# Patient Record
Sex: Female | Born: 1990 | Race: Black or African American | Hispanic: No | Marital: Single | Smoking: Current every day smoker
Health system: Southern US, Community
[De-identification: ages and names within clinical notes are randomized; demographics above are authoritative.]

## PROBLEM LIST (undated history)

## (undated) DIAGNOSIS — Z789 Other specified health status: Secondary | ICD-10-CM

---

## 2010-03-26 ENCOUNTER — Emergency Department (HOSPITAL_COMMUNITY): Admission: EM | Admit: 2010-03-26 | Discharge: 2010-03-26 | Payer: Self-pay | Admitting: Emergency Medicine

## 2010-09-07 ENCOUNTER — Inpatient Hospital Stay (HOSPITAL_COMMUNITY): Admission: AD | Admit: 2010-09-07 | Discharge: 2010-09-10 | Payer: Self-pay | Admitting: Obstetrics

## 2010-09-08 ENCOUNTER — Encounter (INDEPENDENT_AMBULATORY_CARE_PROVIDER_SITE_OTHER): Payer: Self-pay | Admitting: Obstetrics

## 2011-01-18 LAB — CBC
HCT: 24.2 % — ABNORMAL LOW (ref 36.0–46.0)
Hemoglobin: 12.4 g/dL (ref 12.0–15.0)
Hemoglobin: 8.2 g/dL — ABNORMAL LOW (ref 12.0–15.0)
Platelets: 161 10*3/uL (ref 150–400)
RBC: 4.03 MIL/uL (ref 3.87–5.11)
RDW: 15.8 % — ABNORMAL HIGH (ref 11.5–15.5)
WBC: 11.6 10*3/uL — ABNORMAL HIGH (ref 4.0–10.5)

## 2011-01-18 LAB — RPR: RPR Ser Ql: NONREACTIVE

## 2011-12-29 ENCOUNTER — Other Ambulatory Visit (HOSPITAL_COMMUNITY): Payer: Self-pay | Admitting: Obstetrics

## 2011-12-29 DIAGNOSIS — Z3490 Encounter for supervision of normal pregnancy, unspecified, unspecified trimester: Secondary | ICD-10-CM

## 2012-01-06 ENCOUNTER — Encounter (HOSPITAL_COMMUNITY): Payer: Self-pay

## 2012-01-06 ENCOUNTER — Ambulatory Visit (HOSPITAL_COMMUNITY)
Admission: RE | Admit: 2012-01-06 | Discharge: 2012-01-06 | Disposition: A | Discharge status: Home / Self Care | Payer: Managed Care, Other (non HMO) | Source: Ambulatory Visit | Attending: Obstetrics | Admitting: Obstetrics

## 2012-01-06 ENCOUNTER — Other Ambulatory Visit (HOSPITAL_COMMUNITY): Payer: Self-pay | Admitting: Obstetrics

## 2012-01-06 DIAGNOSIS — Z3490 Encounter for supervision of normal pregnancy, unspecified, unspecified trimester: Secondary | ICD-10-CM

## 2012-01-06 DIAGNOSIS — Z3689 Encounter for other specified antenatal screening: Secondary | ICD-10-CM | POA: Insufficient documentation

## 2012-01-08 LAB — OB RESULTS CONSOLE RPR: RPR: NONREACTIVE

## 2012-01-08 LAB — OB RESULTS CONSOLE HEPATITIS B SURFACE ANTIGEN: Hepatitis B Surface Ag: NEGATIVE

## 2012-01-08 LAB — OB RESULTS CONSOLE RUBELLA ANTIBODY, IGM: Rubella: IMMUNE

## 2012-01-08 LAB — OB RESULTS CONSOLE HIV ANTIBODY (ROUTINE TESTING): HIV: NONREACTIVE

## 2012-01-08 LAB — OB RESULTS CONSOLE ABO/RH: RH Type: POSITIVE

## 2012-02-22 ENCOUNTER — Other Ambulatory Visit (HOSPITAL_COMMUNITY): Payer: Self-pay | Admitting: Obstetrics

## 2012-02-22 DIAGNOSIS — Z3689 Encounter for other specified antenatal screening: Secondary | ICD-10-CM

## 2012-03-09 ENCOUNTER — Ambulatory Visit (HOSPITAL_COMMUNITY)
Admission: RE | Admit: 2012-03-09 | Discharge: 2012-03-09 | Disposition: A | Discharge status: Home / Self Care | Payer: Managed Care, Other (non HMO) | Source: Ambulatory Visit | Attending: Obstetrics | Admitting: Obstetrics

## 2012-03-09 DIAGNOSIS — Z363 Encounter for antenatal screening for malformations: Secondary | ICD-10-CM | POA: Insufficient documentation

## 2012-03-09 DIAGNOSIS — O358XX0 Maternal care for other (suspected) fetal abnormality and damage, not applicable or unspecified: Secondary | ICD-10-CM | POA: Insufficient documentation

## 2012-03-09 DIAGNOSIS — O34219 Maternal care for unspecified type scar from previous cesarean delivery: Secondary | ICD-10-CM | POA: Insufficient documentation

## 2012-03-09 DIAGNOSIS — Z1389 Encounter for screening for other disorder: Secondary | ICD-10-CM | POA: Insufficient documentation

## 2012-03-09 DIAGNOSIS — Z3689 Encounter for other specified antenatal screening: Secondary | ICD-10-CM

## 2012-06-17 LAB — OB RESULTS CONSOLE GBS: GBS: POSITIVE

## 2012-07-04 ENCOUNTER — Inpatient Hospital Stay (HOSPITAL_COMMUNITY): Payer: Managed Care, Other (non HMO) | Admitting: Anesthesiology

## 2012-07-04 ENCOUNTER — Inpatient Hospital Stay (HOSPITAL_COMMUNITY)
Admission: AD | Admit: 2012-07-04 | Discharge: 2012-07-06 | DRG: 766 | Disposition: A | Discharge status: Home / Self Care | Payer: Managed Care, Other (non HMO) | Source: Ambulatory Visit | Attending: Obstetrics | Admitting: Obstetrics

## 2012-07-04 ENCOUNTER — Encounter (HOSPITAL_COMMUNITY)
Admission: AD | Disposition: A | Discharge status: Home / Self Care | Payer: Self-pay | Source: Ambulatory Visit | Attending: Obstetrics

## 2012-07-04 ENCOUNTER — Encounter (HOSPITAL_COMMUNITY): Payer: Self-pay | Admitting: *Deleted

## 2012-07-04 ENCOUNTER — Encounter (HOSPITAL_COMMUNITY): Payer: Self-pay | Admitting: Anesthesiology

## 2012-07-04 DIAGNOSIS — O34219 Maternal care for unspecified type scar from previous cesarean delivery: Secondary | ICD-10-CM | POA: Diagnosis present

## 2012-07-04 DIAGNOSIS — O480 Post-term pregnancy: Secondary | ICD-10-CM | POA: Diagnosis present

## 2012-07-04 DIAGNOSIS — Z98891 History of uterine scar from previous surgery: Secondary | ICD-10-CM

## 2012-07-04 HISTORY — DX: Other specified health status: Z78.9

## 2012-07-04 LAB — CBC
MCH: 27.9 pg (ref 26.0–34.0)
MCHC: 32.7 g/dL (ref 30.0–36.0)
MCV: 85.2 fL (ref 78.0–100.0)
Platelets: 161 10*3/uL (ref 150–400)
RBC: 3.84 MIL/uL — ABNORMAL LOW (ref 3.87–5.11)

## 2012-07-04 LAB — RPR: RPR Ser Ql: NONREACTIVE

## 2012-07-04 LAB — PREPARE RBC (CROSSMATCH)

## 2012-07-04 LAB — ABO/RH: ABO/RH(D): A POS

## 2012-07-04 SURGERY — Surgical Case
Anesthesia: Epidural | Site: Abdomen | Wound class: Clean Contaminated

## 2012-07-04 MED ORDER — MORPHINE SULFATE (PF) 0.5 MG/ML IJ SOLN
INTRAMUSCULAR | Status: DC | PRN
  Administered 2012-07-04: 4 mg via EPIDURAL

## 2012-07-04 MED ORDER — KETOROLAC TROMETHAMINE 30 MG/ML IJ SOLN: 30.0000 mg | Freq: Four times a day (QID) | INTRAMUSCULAR | Status: AC | PRN

## 2012-07-04 MED ORDER — SCOPOLAMINE 1 MG/3DAYS TD PT72
MEDICATED_PATCH | TRANSDERMAL | Status: AC
  Filled 2012-07-04: qty 1

## 2012-07-04 MED ORDER — CEFAZOLIN SODIUM-DEXTROSE 2-3 GM-% IV SOLR
2.0000 g | Freq: Once | INTRAVENOUS | Status: AC
  Administered 2012-07-04: 2 g via INTRAVENOUS
  Filled 2012-07-04: qty 50

## 2012-07-04 MED ORDER — LACTATED RINGERS IV SOLN
500.0000 mL | Freq: Once | INTRAVENOUS | Status: AC
  Administered 2012-07-04: 07:00:00 via INTRAVENOUS

## 2012-07-04 MED ORDER — OXYCODONE-ACETAMINOPHEN 5-325 MG PO TABS: 1.0000 | ORAL_TABLET | ORAL | Status: DC | PRN

## 2012-07-04 MED ORDER — MEPERIDINE HCL 25 MG/ML IJ SOLN
6.2500 mg | INTRAMUSCULAR | Status: DC | PRN
  Administered 2012-07-04: 6.25 mg via INTRAVENOUS

## 2012-07-04 MED ORDER — FENTANYL CITRATE 0.05 MG/ML IJ SOLN: 25.0000 ug | INTRAMUSCULAR | Status: DC | PRN

## 2012-07-04 MED ORDER — OXYTOCIN 40 UNITS IN LACTATED RINGERS INFUSION - SIMPLE MED: 62.5000 mL/h | Freq: Once | INTRAVENOUS | Status: DC

## 2012-07-04 MED ORDER — ACETAMINOPHEN 325 MG PO TABS: 650.0000 mg | ORAL_TABLET | ORAL | Status: DC | PRN

## 2012-07-04 MED ORDER — PHENYLEPHRINE 40 MCG/ML (10ML) SYRINGE FOR IV PUSH (FOR BLOOD PRESSURE SUPPORT): 80.0000 ug | PREFILLED_SYRINGE | INTRAVENOUS | Status: DC | PRN

## 2012-07-04 MED ORDER — LIDOCAINE HCL (PF) 1 % IJ SOLN: 30.0000 mL | INTRAMUSCULAR | Status: DC | PRN

## 2012-07-04 MED ORDER — SCOPOLAMINE 1 MG/3DAYS TD PT72
1.0000 | MEDICATED_PATCH | Freq: Once | TRANSDERMAL | Status: DC
  Administered 2012-07-04: 1.5 mg via TRANSDERMAL

## 2012-07-04 MED ORDER — OXYTOCIN BOLUS FROM INFUSION
250.0000 mL | Freq: Once | INTRAVENOUS | Status: DC
  Filled 2012-07-04: qty 500

## 2012-07-04 MED ORDER — CITRIC ACID-SODIUM CITRATE 334-500 MG/5ML PO SOLN
ORAL | Status: AC
  Administered 2012-07-04: 30 mL via ORAL
  Filled 2012-07-04: qty 15

## 2012-07-04 MED ORDER — BUTORPHANOL TARTRATE 1 MG/ML IJ SOLN
1.0000 mg | INTRAMUSCULAR | Status: DC | PRN
  Administered 2012-07-04: 1 mg via INTRAVENOUS
  Filled 2012-07-04: qty 1

## 2012-07-04 MED ORDER — LIDOCAINE HCL (PF) 1 % IJ SOLN
INTRAMUSCULAR | Status: DC | PRN
  Administered 2012-07-04 (×3): 4 mL

## 2012-07-04 MED ORDER — MEPERIDINE HCL 25 MG/ML IJ SOLN
INTRAMUSCULAR | Status: AC
  Filled 2012-07-04: qty 1

## 2012-07-04 MED ORDER — ONDANSETRON HCL 4 MG/2ML IJ SOLN
INTRAMUSCULAR | Status: DC | PRN
  Administered 2012-07-04: 4 mg via INTRAVENOUS

## 2012-07-04 MED ORDER — EPHEDRINE 5 MG/ML INJ: 10.0000 mg | INTRAVENOUS | Status: DC | PRN

## 2012-07-04 MED ORDER — MORPHINE SULFATE 0.5 MG/ML IJ SOLN
INTRAMUSCULAR | Status: AC
  Filled 2012-07-04: qty 10

## 2012-07-04 MED ORDER — KETOROLAC TROMETHAMINE 30 MG/ML IJ SOLN
30.0000 mg | Freq: Four times a day (QID) | INTRAMUSCULAR | Status: AC | PRN
  Administered 2012-07-04 – 2012-07-05 (×2): 30 mg via INTRAVENOUS
  Filled 2012-07-04: qty 1

## 2012-07-04 MED ORDER — LACTATED RINGERS IV SOLN
INTRAVENOUS | Status: DC | PRN
  Administered 2012-07-04 (×3): via INTRAVENOUS

## 2012-07-04 MED ORDER — TERBUTALINE SULFATE 1 MG/ML IJ SOLN
0.2500 mg | Freq: Once | INTRAMUSCULAR | Status: AC | PRN
  Administered 2012-07-04: 0.25 mg via SUBCUTANEOUS

## 2012-07-04 MED ORDER — PENICILLIN G POTASSIUM 5000000 UNITS IJ SOLR
2.5000 10*6.[IU] | INTRAVENOUS | Status: DC
  Administered 2012-07-04 (×4): 2.5 10*6.[IU] via INTRAVENOUS
  Filled 2012-07-04 (×6): qty 2.5

## 2012-07-04 MED ORDER — FENTANYL 2.5 MCG/ML BUPIVACAINE 1/10 % EPIDURAL INFUSION (WH - ANES)
14.0000 mL/h | INTRAMUSCULAR | Status: DC
  Administered 2012-07-04: 14 mL/h via EPIDURAL
  Administered 2012-07-04: 12 mL/h via EPIDURAL
  Administered 2012-07-04 (×2): 14 mL/h via EPIDURAL
  Filled 2012-07-04 (×5): qty 60

## 2012-07-04 MED ORDER — MORPHINE SULFATE (PF) 0.5 MG/ML IJ SOLN
INTRAMUSCULAR | Status: DC | PRN
  Administered 2012-07-04: 1 mg via INTRAVENOUS

## 2012-07-04 MED ORDER — OXYTOCIN 40 UNITS IN LACTATED RINGERS INFUSION - SIMPLE MED
1.0000 m[IU]/min | INTRAVENOUS | Status: DC
  Administered 2012-07-04: 1 m[IU]/min via INTRAVENOUS
  Filled 2012-07-04: qty 1000

## 2012-07-04 MED ORDER — FLEET ENEMA 7-19 GM/118ML RE ENEM: 1.0000 | ENEMA | RECTAL | Status: DC | PRN

## 2012-07-04 MED ORDER — MEPERIDINE HCL 25 MG/ML IJ SOLN
INTRAMUSCULAR | Status: DC | PRN
  Administered 2012-07-04: 12.5 mg via INTRAVENOUS

## 2012-07-04 MED ORDER — KETOROLAC TROMETHAMINE 30 MG/ML IJ SOLN
INTRAMUSCULAR | Status: AC
  Administered 2012-07-05: 30 mg via INTRAVENOUS
  Filled 2012-07-04: qty 1

## 2012-07-04 MED ORDER — PHENYLEPHRINE HCL 10 MG/ML IJ SOLN
INTRAMUSCULAR | Status: DC | PRN
  Administered 2012-07-04: 80 ug via INTRAVENOUS

## 2012-07-04 MED ORDER — SODIUM BICARBONATE 8.4 % IV SOLN
INTRAVENOUS | Status: DC | PRN
  Administered 2012-07-04: 5 mL via EPIDURAL

## 2012-07-04 MED ORDER — DIPHENHYDRAMINE HCL 50 MG/ML IJ SOLN: 12.5000 mg | INTRAMUSCULAR | Status: DC | PRN

## 2012-07-04 MED ORDER — IBUPROFEN 600 MG PO TABS: 600.0000 mg | ORAL_TABLET | Freq: Four times a day (QID) | ORAL | Status: DC | PRN

## 2012-07-04 MED ORDER — LACTATED RINGERS IV SOLN: INTRAVENOUS | Status: DC

## 2012-07-04 MED ORDER — ONDANSETRON HCL 4 MG/2ML IJ SOLN: 4.0000 mg | Freq: Four times a day (QID) | INTRAMUSCULAR | Status: DC | PRN

## 2012-07-04 MED ORDER — LACTATED RINGERS IV SOLN
INTRAVENOUS | Status: DC
  Administered 2012-07-04 (×4): via INTRAVENOUS
  Administered 2012-07-04: 300 mL via INTRAVENOUS
  Administered 2012-07-04: 09:00:00 via INTRAVENOUS

## 2012-07-04 MED ORDER — PENICILLIN G POTASSIUM 5000000 UNITS IJ SOLR
5.0000 10*6.[IU] | Freq: Once | INTRAVENOUS | Status: AC
  Administered 2012-07-04: 5 10*6.[IU] via INTRAVENOUS
  Filled 2012-07-04: qty 5

## 2012-07-04 MED ORDER — CITRIC ACID-SODIUM CITRATE 334-500 MG/5ML PO SOLN
30.0000 mL | ORAL | Status: DC | PRN
  Administered 2012-07-04: 30 mL via ORAL

## 2012-07-04 MED ORDER — EPHEDRINE 5 MG/ML INJ
10.0000 mg | INTRAVENOUS | Status: DC | PRN
  Filled 2012-07-04: qty 4

## 2012-07-04 MED ORDER — PHENYLEPHRINE 40 MCG/ML (10ML) SYRINGE FOR IV PUSH (FOR BLOOD PRESSURE SUPPORT)
80.0000 ug | PREFILLED_SYRINGE | INTRAVENOUS | Status: DC | PRN
  Filled 2012-07-04: qty 5

## 2012-07-04 MED ORDER — TERBUTALINE SULFATE 1 MG/ML IJ SOLN
INTRAMUSCULAR | Status: AC
  Filled 2012-07-04: qty 1

## 2012-07-04 SURGICAL SUPPLY — 32 items
CLOTH BEACON ORANGE TIMEOUT ST (SAFETY) ×2 IMPLANT
DERMABOND ADVANCED (GAUZE/BANDAGES/DRESSINGS) ×1
DERMABOND ADVANCED .7 DNX12 (GAUZE/BANDAGES/DRESSINGS) ×1 IMPLANT
DRESSING TELFA 8X3 (GAUZE/BANDAGES/DRESSINGS) ×2 IMPLANT
DRSG COVADERM 4X10 (GAUZE/BANDAGES/DRESSINGS) ×2 IMPLANT
ELECT REM PT RETURN 9FT ADLT (ELECTROSURGICAL) ×2
ELECTRODE REM PT RTRN 9FT ADLT (ELECTROSURGICAL) ×1 IMPLANT
EXTRACTOR VACUUM M CUP 4 TUBE (SUCTIONS) IMPLANT
GAUZE SPONGE 4X4 12PLY STRL LF (GAUZE/BANDAGES/DRESSINGS) ×2 IMPLANT
GLOVE BIO SURGEON STRL SZ8.5 (GLOVE) ×4 IMPLANT
GOWN PREVENTION PLUS LG XLONG (DISPOSABLE) ×2 IMPLANT
GOWN PREVENTION PLUS XXLARGE (GOWN DISPOSABLE) ×2 IMPLANT
KIT ABG SYR 3ML LUER SLIP (SYRINGE) IMPLANT
NEEDLE HYPO 25X5/8 SAFETYGLIDE (NEEDLE) IMPLANT
NS IRRIG 1000ML POUR BTL (IV SOLUTION) ×2 IMPLANT
PACK C SECTION WH (CUSTOM PROCEDURE TRAY) ×2 IMPLANT
PAD ABD 7.5X8 STRL (GAUZE/BANDAGES/DRESSINGS) ×2 IMPLANT
PAD OB MATERNITY 4.3X12.25 (PERSONAL CARE ITEMS) IMPLANT
SLEEVE SCD COMPRESS KNEE MED (MISCELLANEOUS) ×2 IMPLANT
SUT CHROMIC 0 CT 802H (SUTURE) ×2 IMPLANT
SUT CHROMIC 1 CTX 36 (SUTURE) ×4 IMPLANT
SUT CHROMIC 2 0 SH (SUTURE) ×2 IMPLANT
SUT GUT PLAIN 0 CT-3 TAN 27 (SUTURE) IMPLANT
SUT MON AB 4-0 PS1 27 (SUTURE) IMPLANT
SUT VIC AB 0 CT1 18XCR BRD8 (SUTURE) IMPLANT
SUT VIC AB 0 CT1 8-18 (SUTURE)
SUT VIC AB 0 CTX 36 (SUTURE) ×2
SUT VIC AB 0 CTX36XBRD ANBCTRL (SUTURE) ×2 IMPLANT
TAPE CLOTH SURG 4X10 WHT LF (GAUZE/BANDAGES/DRESSINGS) ×2 IMPLANT
TOWEL OR 17X24 6PK STRL BLUE (TOWEL DISPOSABLE) ×4 IMPLANT
TRAY FOLEY CATH 14FR (SET/KITS/TRAYS/PACK) IMPLANT
WATER STERILE IRR 1000ML POUR (IV SOLUTION) IMPLANT

## 2012-07-04 NOTE — Progress Notes (Signed)
Patient ID: Jasmine Ramsey, female   DOB: 11/13/1990, 21 y.o.   MRN: [id number] Patient is still 5 cm unchanged from 5:00 this evening her contractions were not adequate and her time of Pitocin was turned up she has prolonged variable decelerations so were unable to get her in to adequate labor so the patient will be delivered by repeat C-section for nonreassuring fetal heart rate tracing

## 2012-07-04 NOTE — Transfer of Care (Signed)
Immediate Anesthesia Transfer of Care Note  Patient: Jasmine Ramsey  Procedure(s) Performed: Procedure(s) (LRB): CESAREAN SECTION (N/A)  Patient Location: PACU  Anesthesia Type: Epidural  Level of Consciousness: awake, alert  and oriented  Airway & Oxygen Therapy: Patient Spontanous Breathing  Post-op Assessment: Report given to PACU RN and Post -op Vital signs reviewed and stable  Post vital signs: Reviewed and stable  Complications: No apparent anesthesia complications

## 2012-07-04 NOTE — Anesthesia Procedure Notes (Signed)
Epidural Patient location during procedure: OB Start time: 07/04/2012 7:42 AM Reason for block: procedure for pain  Staffing Performed by: anesthesiologist   Preanesthetic Checklist Completed: patient identified, site marked, surgical consent, pre-op evaluation, timeout performed, IV checked, risks and benefits discussed and monitors and equipment checked  Epidural Patient position: sitting Prep: site prepped and draped and DuraPrep Patient monitoring: continuous pulse ox and blood pressure Approach: midline Injection technique: LOR air  Needle:  Needle type: Tuohy  Needle gauge: 17 G Needle length: 9 cm Catheter type: closed end flexible Catheter size: 19 Gauge Catheter at skin depth: 12 cm Test dose: negative  Assessment Events: blood not aspirated, injection not painful, no injection resistance, negative IV test and no paresthesia  Additional Notes Discussed risk of headache, infection, bleeding, nerve injury and failed or incomplete block.  Patient voices understanding and wishes to proceed.

## 2012-07-04 NOTE — Progress Notes (Signed)
IUPC was misplaced during repositioning

## 2012-07-04 NOTE — Progress Notes (Signed)
Patient ID: Jasmine Ramsey, female   DOB: 1991-06-01, 21 y.o.   MRN: [id number] Tracing was reactive up to the point the patient and her epidural and then she started having some deep. The D cells episodes and was discontinued she got terbutaline 0.25 new IUPC was inserted and amnioinfusion began if the tracing remains normal been asked and all restart Pitocin to the present time is observing changes

## 2012-07-04 NOTE — Anesthesia Postprocedure Evaluation (Signed)
  Anesthesia Post-op Note  Patient: Jasmine Ramsey  Procedure(s) Performed: Procedure(s) (LRB): CESAREAN SECTION (N/A)  Patient Location: PACU  Anesthesia Type: Epidural  Level of Consciousness: awake, alert  and oriented  Airway and Oxygen Therapy: Patient Spontanous Breathing  Post-op Pain: none  Post-op Assessment: Post-op Vital signs reviewed, Patient's Cardiovascular Status Stable, Respiratory Function Stable, Patent Airway, No signs of Nausea or vomiting, Pain level controlled, No headache and No backache  Post-op Vital Signs: Reviewed and stable  Complications: No apparent anesthesia complications

## 2012-07-04 NOTE — H&P (Signed)
This is Dr. Francoise Ceo dictating the history and physical on  Jasmine Ramsey she's a 21 year old gravida 2 para 1001 who had a C-section in the past and now desires vaginal delivery her EDC is 07/14/2012 making her 38 weeks and 6 days positive GBS she is receiving penicillin at this time her membranes ruptured spontaneously at midnight fluid clear and she is now on low-dose Pitocin cervix is 2 cm 100% and vertex at a 0 station and IUPC was inserted and her contractions are irregular Past medical history negative Past surgical history she had a C-section because of nonreassuring fetal heart rate tracing Social history negative Family history negative System review noncontributory Physical exam revealed a well-developed female in early labor HEENT negative Breasts negative Heart regular rhythm no murmurs no gallops Lungs clear to P&A Abdomen term estimated fetal weight 6 lbs. 8 oz. Pelvic as described above Extremities negative

## 2012-07-04 NOTE — Progress Notes (Signed)
Dr. Gaynell Face at bedside, evaluating pt status and discussing with pt plan of care, discussing with pt risks and benefits of c-section, pt verbalizes understanding and consents to c-section

## 2012-07-04 NOTE — Progress Notes (Signed)
Amnioinfusion started on previous shift

## 2012-07-04 NOTE — Progress Notes (Signed)
Pt moving in bed---will continue to assess

## 2012-07-04 NOTE — MAU Note (Signed)
Pt reports "i guess my water broke" noted clear fluid since 0000. cramping

## 2012-07-04 NOTE — Progress Notes (Signed)
Pt denies pain at this time

## 2012-07-04 NOTE — Progress Notes (Signed)
Updated provider of variable and late decels--orders to continue to monitor and decrease pitocin if needed

## 2012-07-04 NOTE — Op Note (Signed)
Assuring fetal heart rate tracing didn't progress Postop diagnosis is same Anesthesia was epidural Surgeon Dr. Francoise Ceo Procedure patient placed on the operating table in the supine position abdomen prepped and draped bladder emptied with a Foley catheter a transverse suprapubic incision made through the old scar carried down to the rectus fascia fascia cleaned and incised the length of the incision recti muscles retracted laterally the peritoneum incised longitudinally a transverse incision was made on the visceroperitoneum above the bladder and the bladder mobilized inferiorly a transverse incision made on the uterus the fluid was clear patient delivered from the LOP position of a female Apgar 8 and 9 the placenta was posterior fundal removed manually  And the uterus cleaned withdry laps the uterus was closed in one layer with   continous suture number chromic one chromic hemostasis was satisfactory the bladder flap reattached with 2-0 chromic uterus well contracted tubes and ovaries normal abdomen chosen as peritoneum closed with 0 chromic fascia closed with 0 Vicryl and the skin closed with staples blood loss was 600 cc

## 2012-07-04 NOTE — Anesthesia Preprocedure Evaluation (Addendum)
Anesthesia Evaluation  Patient identified by MRN, date of birth, ID band Patient awake    Reviewed: Allergy & Precautions, H&P , NPO status , Patient's Chart, lab work & pertinent test results, reviewed documented beta blocker date and time   History of Anesthesia Complications Negative for: history of anesthetic complications  Airway Mallampati: III TM Distance: >3 FB Neck ROM: full    Dental  (+) Teeth Intact   Pulmonary former smoker (quit 12/12),  breath sounds clear to auscultation        Cardiovascular negative cardio ROS  Rhythm:regular Rate:Normal     Neuro/Psych negative neurological ROS  negative psych ROS   GI/Hepatic negative GI ROS, Neg liver ROS,   Endo/Other  negative endocrine ROS  Renal/GU negative Renal ROS     Musculoskeletal   Abdominal   Peds  Hematology negative hematology ROS (+)   Anesthesia Other Findings   Reproductive/Obstetrics (+) Pregnancy (h/o c/s x1, attempting VBAC)                           Anesthesia Physical Anesthesia Plan  ASA: II and Emergent  Anesthesia Plan: Epidural   Post-op Pain Management:    Induction:   Airway Management Planned:   Additional Equipment:   Intra-op Plan:   Post-operative Plan:   Informed Consent: I have reviewed the patients History and Physical, chart, labs and discussed the procedure including the risks, benefits and alternatives for the proposed anesthesia with the patient or authorized representative who has indicated his/her understanding and acceptance.     Plan Discussed with:   Anesthesia Plan Comments:        Anesthesia Quick Evaluation

## 2012-07-05 ENCOUNTER — Encounter (HOSPITAL_COMMUNITY): Payer: Self-pay | Admitting: *Deleted

## 2012-07-05 LAB — CBC
HCT: 30.4 % — ABNORMAL LOW (ref 36.0–46.0)
Hemoglobin: 10.1 g/dL — ABNORMAL LOW (ref 12.0–15.0)
MCHC: 33.2 g/dL (ref 30.0–36.0)
MCV: 84.7 fL (ref 78.0–100.0)
RDW: 16.4 % — ABNORMAL HIGH (ref 11.5–15.5)
WBC: 11.8 10*3/uL — ABNORMAL HIGH (ref 4.0–10.5)

## 2012-07-05 MED ORDER — DIPHENHYDRAMINE HCL 50 MG/ML IJ SOLN: 25.0000 mg | INTRAMUSCULAR | Status: DC | PRN

## 2012-07-05 MED ORDER — DIPHENHYDRAMINE HCL 25 MG PO CAPS
25.0000 mg | ORAL_CAPSULE | Freq: Four times a day (QID) | ORAL | Status: DC | PRN
  Filled 2012-07-05: qty 1

## 2012-07-05 MED ORDER — PRENATAL MULTIVITAMIN CH
1.0000 | ORAL_TABLET | Freq: Every day | ORAL | Status: DC
  Administered 2012-07-05: 1 via ORAL
  Filled 2012-07-05 (×2): qty 1

## 2012-07-05 MED ORDER — DIPHENHYDRAMINE HCL 50 MG/ML IJ SOLN: 12.5000 mg | INTRAMUSCULAR | Status: DC | PRN

## 2012-07-05 MED ORDER — ONDANSETRON HCL 4 MG/2ML IJ SOLN: 4.0000 mg | INTRAMUSCULAR | Status: DC | PRN

## 2012-07-05 MED ORDER — NALOXONE HCL 0.4 MG/ML IJ SOLN: 0.4000 mg | INTRAMUSCULAR | Status: DC | PRN

## 2012-07-05 MED ORDER — IBUPROFEN 600 MG PO TABS: 600.0000 mg | ORAL_TABLET | Freq: Four times a day (QID) | ORAL | Status: DC | PRN

## 2012-07-05 MED ORDER — SODIUM CHLORIDE 0.9 % IJ SOLN: 3.0000 mL | INTRAMUSCULAR | Status: DC | PRN

## 2012-07-05 MED ORDER — SODIUM CHLORIDE 0.9 % IV SOLN
1.0000 ug/kg/h | INTRAVENOUS | Status: DC | PRN
  Filled 2012-07-05: qty 2.5

## 2012-07-05 MED ORDER — IBUPROFEN 600 MG PO TABS
600.0000 mg | ORAL_TABLET | Freq: Four times a day (QID) | ORAL | Status: DC
  Administered 2012-07-05 – 2012-07-06 (×4): 600 mg via ORAL
  Filled 2012-07-05 (×5): qty 1

## 2012-07-05 MED ORDER — TETANUS-DIPHTH-ACELL PERTUSSIS 5-2.5-18.5 LF-MCG/0.5 IM SUSP: 0.5000 mL | Freq: Once | INTRAMUSCULAR | Status: DC

## 2012-07-05 MED ORDER — SIMETHICONE 80 MG PO CHEW
80.0000 mg | CHEWABLE_TABLET | Freq: Three times a day (TID) | ORAL | Status: DC
  Administered 2012-07-05 – 2012-07-06 (×5): 80 mg via ORAL

## 2012-07-05 MED ORDER — LANOLIN HYDROUS EX OINT: 1.0000 "application " | TOPICAL_OINTMENT | CUTANEOUS | Status: DC | PRN

## 2012-07-05 MED ORDER — DIBUCAINE 1 % RE OINT: 1.0000 "application " | TOPICAL_OINTMENT | RECTAL | Status: DC | PRN

## 2012-07-05 MED ORDER — OXYTOCIN 40 UNITS IN LACTATED RINGERS INFUSION - SIMPLE MED: 62.5000 mL/h | INTRAVENOUS | Status: AC

## 2012-07-05 MED ORDER — METOCLOPRAMIDE HCL 5 MG/ML IJ SOLN: 10.0000 mg | Freq: Three times a day (TID) | INTRAMUSCULAR | Status: DC | PRN

## 2012-07-05 MED ORDER — SIMETHICONE 80 MG PO CHEW: 80.0000 mg | CHEWABLE_TABLET | ORAL | Status: DC | PRN

## 2012-07-05 MED ORDER — ONDANSETRON HCL 4 MG PO TABS
4.0000 mg | ORAL_TABLET | ORAL | Status: DC | PRN
Start: 2012-07-05 — End: 2012-07-06

## 2012-07-05 MED ORDER — MENTHOL 3 MG MT LOZG: 1.0000 | LOZENGE | OROMUCOSAL | Status: DC | PRN

## 2012-07-05 MED ORDER — LACTATED RINGERS IV SOLN
INTRAVENOUS | Status: DC
  Administered 2012-07-05: 09:00:00 via INTRAVENOUS

## 2012-07-05 MED ORDER — ONDANSETRON HCL 4 MG/2ML IJ SOLN: 4.0000 mg | Freq: Three times a day (TID) | INTRAMUSCULAR | Status: DC | PRN

## 2012-07-05 MED ORDER — SENNOSIDES-DOCUSATE SODIUM 8.6-50 MG PO TABS: 2.0000 | ORAL_TABLET | Freq: Every day | ORAL | Status: DC

## 2012-07-05 MED ORDER — DIPHENHYDRAMINE HCL 25 MG PO CAPS
25.0000 mg | ORAL_CAPSULE | ORAL | Status: DC | PRN
  Administered 2012-07-05 (×2): 25 mg via ORAL
  Filled 2012-07-05: qty 1

## 2012-07-05 MED ORDER — ZOLPIDEM TARTRATE 5 MG PO TABS
5.0000 mg | ORAL_TABLET | Freq: Every evening | ORAL | Status: DC | PRN
Start: 2012-07-05 — End: 2012-07-06

## 2012-07-05 MED ORDER — OXYCODONE-ACETAMINOPHEN 5-325 MG PO TABS
1.0000 | ORAL_TABLET | ORAL | Status: DC | PRN
  Administered 2012-07-05 (×2): 1 via ORAL
  Filled 2012-07-05 (×2): qty 1

## 2012-07-05 MED ORDER — NALBUPHINE HCL 10 MG/ML IJ SOLN
5.0000 mg | INTRAMUSCULAR | Status: DC | PRN
  Administered 2012-07-05 (×2): 10 mg via INTRAVENOUS
  Filled 2012-07-05: qty 1

## 2012-07-05 MED ORDER — WITCH HAZEL-GLYCERIN EX PADS: 1.0000 "application " | MEDICATED_PAD | CUTANEOUS | Status: DC | PRN

## 2012-07-05 MED ORDER — NALBUPHINE HCL 10 MG/ML IJ SOLN
5.0000 mg | INTRAMUSCULAR | Status: DC | PRN
Start: 2012-07-05 — End: 2012-07-06
  Filled 2012-07-05 (×3): qty 1

## 2012-07-05 NOTE — Progress Notes (Signed)
Patient ID: Jasmine Ramsey, female   DOB: 1991-04-06, 21 y.o.   MRN: [id number] Postop day 1 Vital signs normal Fundus firm The dressing dry Legs negative bili well

## 2012-07-05 NOTE — Anesthesia Postprocedure Evaluation (Signed)
  Anesthesia Post-op Note  Patient: Jasmine Ramsey  Procedure(s) Performed: Procedure(s) (LRB): CESAREAN SECTION (N/A)  Patient Location: Mother/Baby  Anesthesia Type: Epidural  Level of Consciousness: awake and alert   Airway and Oxygen Therapy: Patient Spontanous Breathing  Post-op Pain: none  Post-op Assessment: Patient's Cardiovascular Status Stable, Respiratory Function Stable, Patent Airway, No signs of Nausea or vomiting, Adequate PO intake, Pain level controlled, No headache, No backache, No residual numbness and No residual motor weakness  Post-op Vital Signs: Reviewed and stable  Complications: No apparent anesthesia complications

## 2012-07-05 NOTE — Addendum Note (Signed)
Addendum  created 07/05/12 0950 by Suella Grove, CRNA   Modules edited:Notes Section

## 2012-07-06 NOTE — Discharge Summary (Signed)
Obstetric Discharge Summary Reason for Admission: onset of labor Prenatal Procedures: none Intrapartum Procedures: cesarean: low cervical, transverse Postpartum Procedures: none Complications-Operative and Postpartum: none Hemoglobin  Date Value Range Status  07/05/2012 10.1* 12.0 - 15.0 g/dL Final     HCT  Date Value Range Status  07/05/2012 30.4* 36.0 - 46.0 % Final    Physical Exam:  General: alert Lochia: appropriate Uterine Fundus: firm Incision: healing well DVT Evaluation: No evidence of DVT seen on physical exam.  Discharge Diagnoses: Term Pregnancy-delivered  Discharge Information: Date: 07/06/2012 Activity: pelvic rest Diet: routine Medications: Percocet Condition: stable Instructions: refer to practice specific booklet Discharge to: home Follow-up Information    Follow up with MARSHALL,BERNARD A, MD. Call in 5 days.   Contact information:   [address] [place] [address] [phone]          Newborn Data: Live born female  Birth Weight: 6 lb 6.7 oz (2910 g) APGAR: 8, 9  Home with mother.  MARSHALL,BERNARD A 07/06/2012, 6:53 AM

## 2012-07-07 LAB — TYPE AND SCREEN: Unit division: 0

## 2012-07-10 IMAGING — US US OB DETAIL+14 WK
1 of 2 series · 12 of 28 positions shown · non-contrast
Comparison: none

[Series 1: us ob detail +14 wk · 12 of 110 slices shown]
[im 5/110]
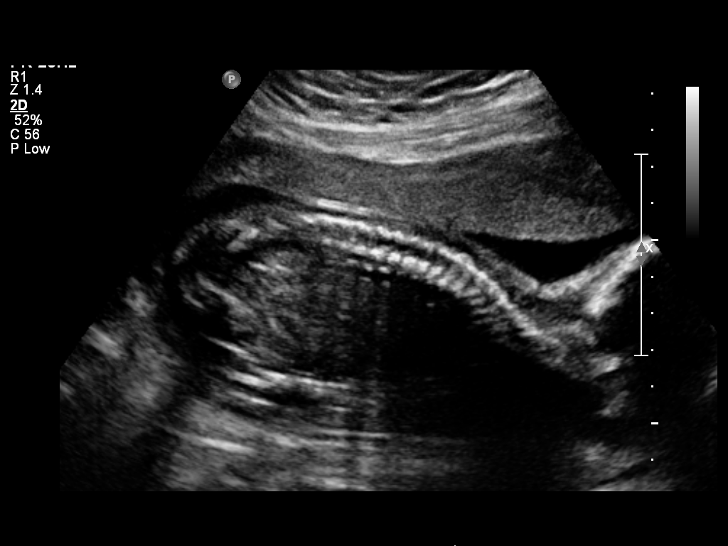
[im 13/110]
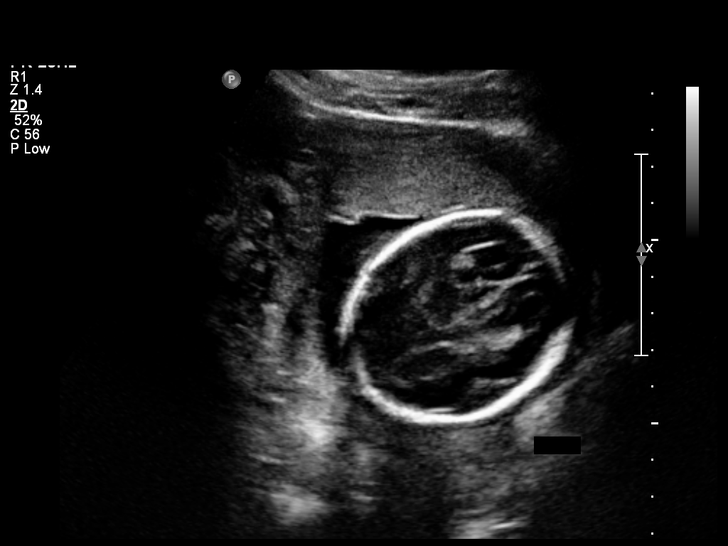
[im 21/110]
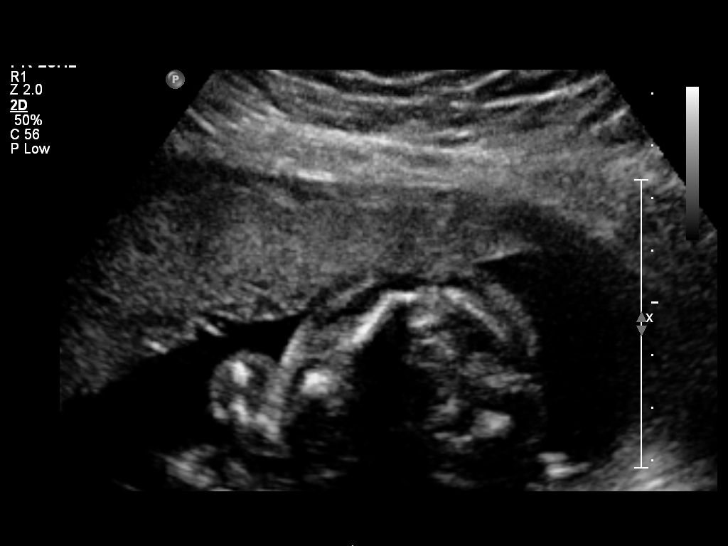
[im 34/110]
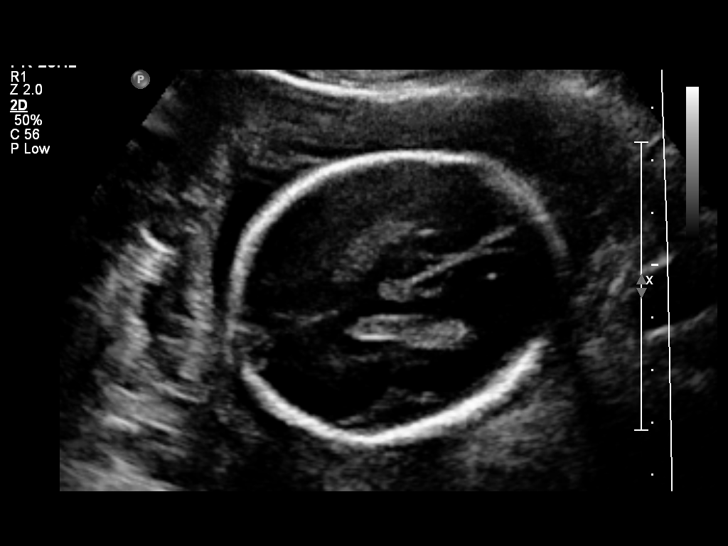
[im 42/110]
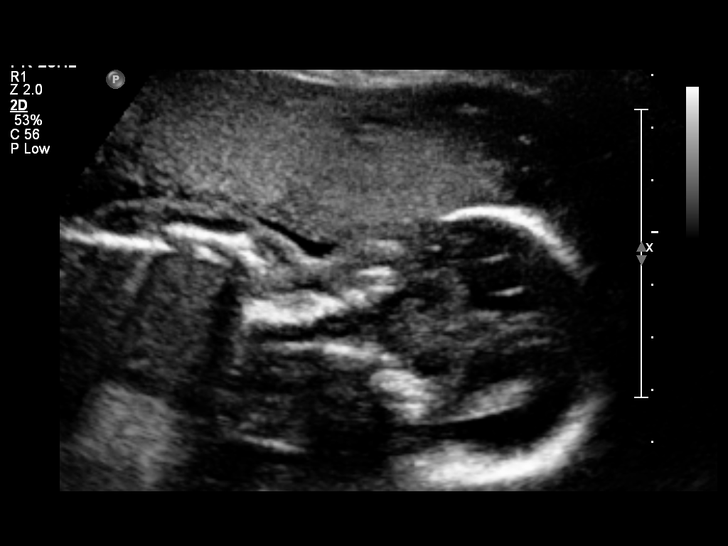
[im 51/110]
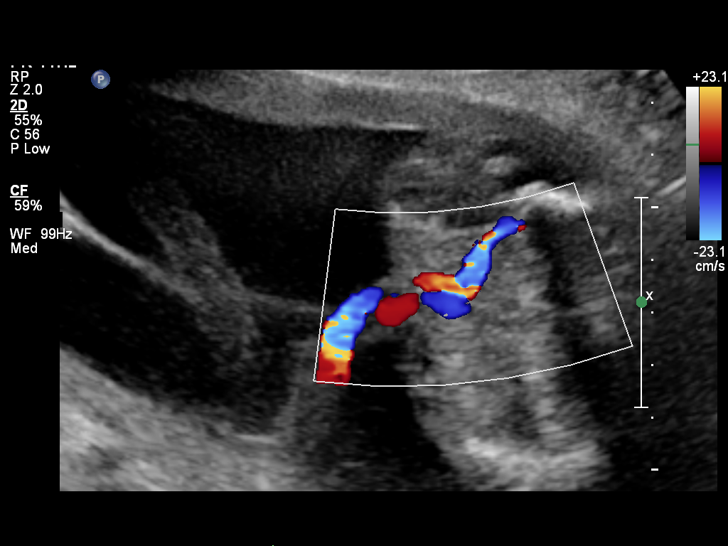
[im 63/110]
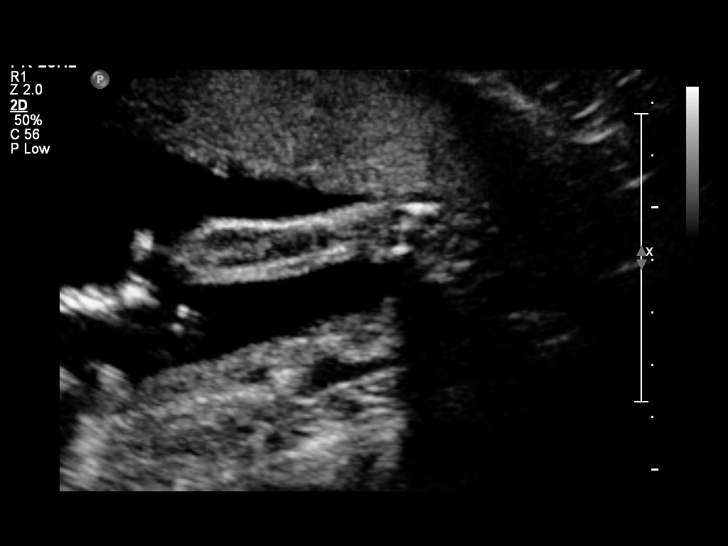
[im 72/110]
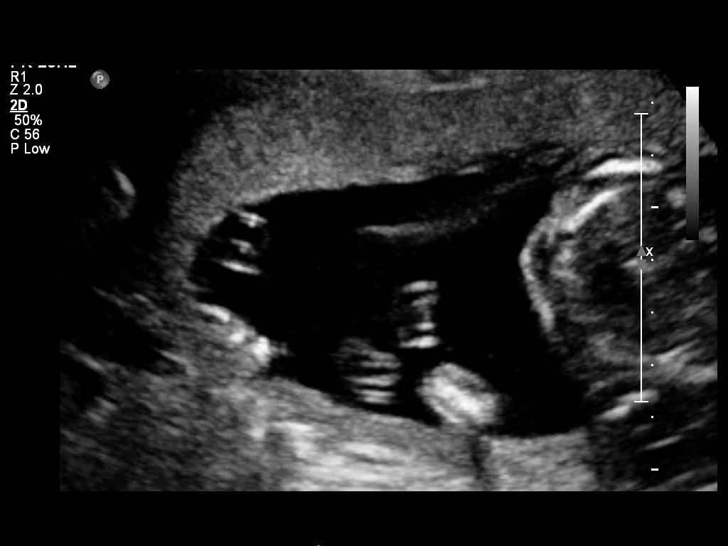
[im 80/110]
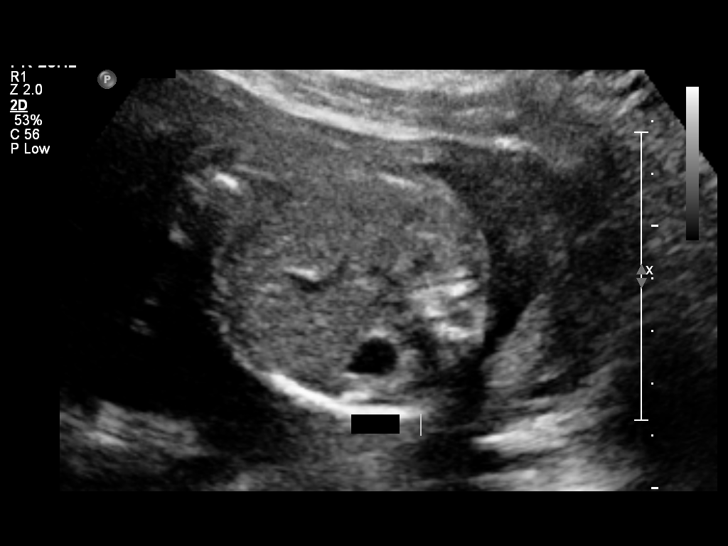
[im 93/110]
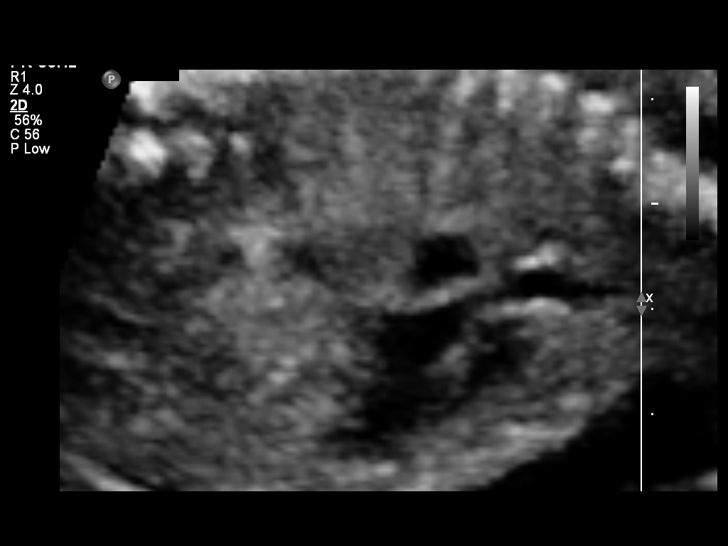
[im 101/110]
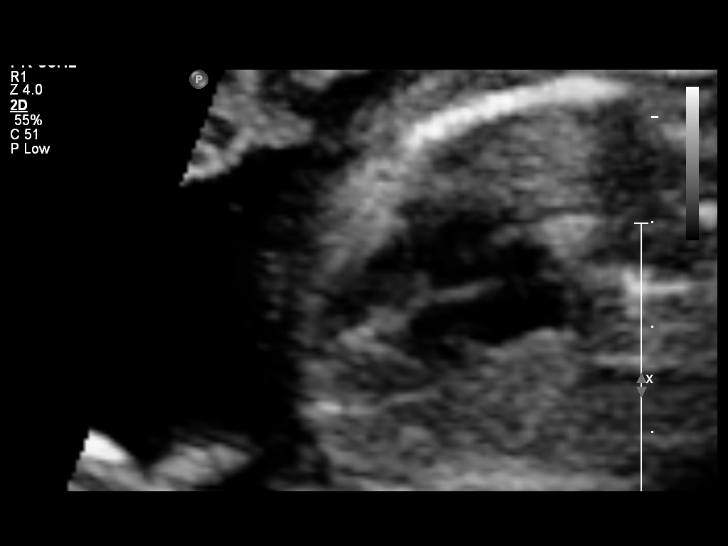
[im 110/110]
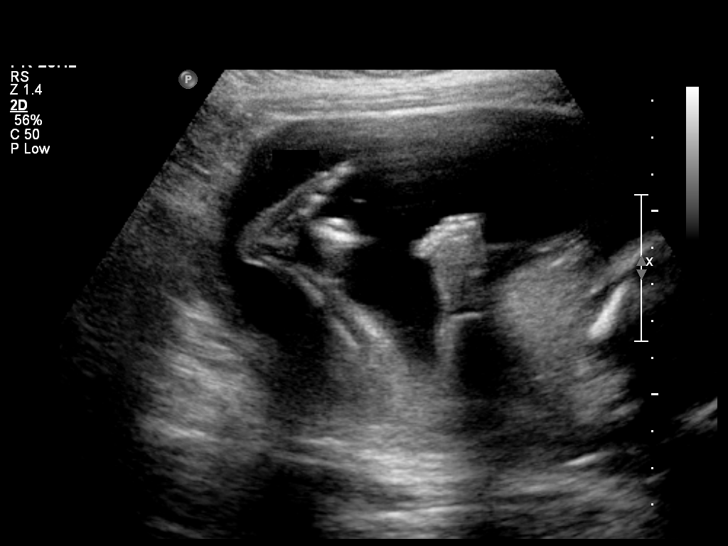

[12 of 28 positions shown; findings below may reference images not displayed]

OBSTETRICS REPORT
                      (Signed Final 03/09/2012 [DATE])

 Order#:         [id number]
Procedures

 US OB DETAIL + 14 WK                                  76811.0
Indications

 Detailed fetal anatomic survey
 Previous cesarean section
Fetal Evaluation

 Cardiac Activity:  Observed
Biometry

 BPD:     51.8  mm    G. Age:   21w 5d                CI:        71.35   70 - 86
                                                      FL/HC:      18.6   18.4 -

 HC:     195.3  mm    G. Age:   21w 5d       47  %    HC/AC:      1.22   1.06 -

 AC:     159.9  mm    G. Age:   21w 1d       28  %    FL/BPD:     70.1   71 - 87
 FL:      36.3  mm    G. Age:   21w 4d       39  %    FL/AC:      22.7   20 - 24
 HUM:     35.3  mm    G. Age:   22w 1d       63  %
 CER:     22.6  mm    G. Age:   21w 2d       41  %

 Est. FW:     418  gm    0 lb 15 oz      39  %
Gestational Age

 U/S Today:     21w 4d                                        EDD:   07/16/12
 Best:          21w 4d    Det. By:   U/S C R L (01/06/12)     EDD:   07/16/12
Anatomy

 Cranium:           Appears normal      Aortic Arch:       Appears normal
 Fetal Cavum:       Appears normal      Ductal Arch:       Appears normal
 Ventricles:        Appears normal      Diaphragm:         Appears normal
 Choroid Plexus:    Appears normal      Stomach:           Appears normal
 Cerebellum:        Appears normal      Abdomen:           Appears normal
 Posterior Fossa:   Appears normal      Abdominal Wall:    Appears nml
                                                           (cord insert,
                                                           abd wall)
 Nuchal Fold:       Not applicable      Cord Vessels:      Appears normal
                    (>20 wks GA)                           (3 vessel cord)
 Face:              Appears normal      Kidneys:           Appear normal
                    (lips/profile/orbit
                    s)
 Heart:             Appears normal      Bladder:           Appears normal
                    (4 chamber &
                    axis)
 RVOT:              Appears normal      Spine:             Appears normal
 LVOT:              Appears normal      Limbs:             Four extremities
                                                           seen

 Other:     Heels and 5th digit visualized. Nasal bone visualized.
            Male gender.
Cervix Uterus Adnexa

 Cervical Length:   3.9       cm

 Cervix:       Normal appearance by transabdominal scan.
 Left Ovary:   Previously seen.
 Right Ovary:  Previously seen
Impression

 Single intrauterine gestation demonstrating an estimated
 gestational age by ultrasound of 21w 4d  . This correlates
 well with expected EGA by early ultrasound of 21w 4d  .

 No focal fetal or placental abnormalities are noted with a
 good anatomic evaluation possible. No soft markers for Down
 Syndrome are seen. Sonographic modification of Down
 Syndrome risk was not performed as the patient is beyond
 the EGA at which this assessment is typically performed.

 Subjectively and quantitatively normal amniotic fluid volume.
 Normal cervical length and appearance.

 [facility]ATTAS with us.  Please do not hesitate to contact

## 2013-09-07 ENCOUNTER — Encounter (HOSPITAL_COMMUNITY): Payer: Self-pay | Admitting: Emergency Medicine

## 2013-09-07 ENCOUNTER — Emergency Department (INDEPENDENT_AMBULATORY_CARE_PROVIDER_SITE_OTHER)
Admission: EM | Admit: 2013-09-07 | Discharge: 2013-09-07 | Disposition: A | Discharge status: Home / Self Care | Payer: Managed Care, Other (non HMO) | Source: Home / Self Care | Attending: Emergency Medicine | Admitting: Emergency Medicine

## 2013-09-07 DIAGNOSIS — H6590 Unspecified nonsuppurative otitis media, unspecified ear: Secondary | ICD-10-CM

## 2013-09-07 DIAGNOSIS — R51 Headache: Secondary | ICD-10-CM

## 2013-09-07 MED ORDER — TRAMADOL HCL 50 MG PO TABS: 50.0000 mg | ORAL_TABLET | Freq: Four times a day (QID) | ORAL | Status: AC | PRN

## 2013-09-07 MED ORDER — CETIRIZINE-PSEUDOEPHEDRINE ER 5-120 MG PO TB12: 1.0000 | ORAL_TABLET | Freq: Every day | ORAL | Status: AC

## 2013-09-07 MED ORDER — FLUTICASONE PROPIONATE 50 MCG/ACT NA SUSP: 2.0000 | Freq: Every day | NASAL | Status: AC

## 2013-09-07 NOTE — ED Provider Notes (Signed)
Medical screening examination/treatment/procedure(s) were performed by non-physician practitioner and as supervising physician I was immediately available for consultation/collaboration.  Leslee Home, M.D.  Reuben Likes, MD 09/07/13 [phone]

## 2013-09-07 NOTE — ED Provider Notes (Signed)
CSN: [id number]     Arrival date & time 09/07/13  1208 History   First MD Initiated Contact with Patient 09/07/13 1231     Chief Complaint  Patient presents with  . Headache   (Consider location/radiation/quality/duration/timing/severity/associated sxs/prior Treatment)  Patient is a 22 y.o. female presenting with headaches. The history is provided by the patient.  Headache Pain location:  R temporal Quality:  Dull Onset quality:  Gradual Duration:  2 weeks Timing:  Constant Progression:  Unchanged Similar to prior headaches: no   Context: not activity and not exposure to bright light   Relieved by:  None tried Associated symptoms: facial pain and hearing loss   Associated symptoms: no blurred vision, no dizziness, no fatigue, no fever, no nausea, no neck pain, no neck stiffness, no photophobia, no swollen glands, no vomiting and no weakness   Associated symptoms comment:  Reports pain in her right nose and in underneath her right eye.  Portz a feeling of congestion or stuffiness in her right ear.   Asian states she has had a headache for approximately 2 weeks. Denies history is of migraines or other headaches. Denies photophobia denies nausea vomiting or diarrhea.  Past Medical History  Diagnosis Date  . No pertinent past medical history    Past Surgical History  Procedure Laterality Date  . Cesarean section    . Cesarean section  07/04/2012    Procedure: CESAREAN SECTION;  Surgeon: Kathreen Cosier, MD;  Location: [facility];  Service: Gynecology;  Laterality: N/A;   No family history on file. History  Substance Use Topics  . Smoking status: Current Every Day Smoker    Types: Cigarettes    Last Attempt to Quit: 10/22/2011  . Smokeless tobacco: Not on file  . Alcohol Use: No   OB History   Grav Para Term Preterm Abortions TAB SAB Ect Mult Living   2 2 2  0 0 0 0 0 0 2     Review of Systems  Constitutional: Negative.  Negative for fever and fatigue.  HENT: Positive for  hearing loss.   Eyes: Negative.  Negative for blurred vision and photophobia.  Respiratory: Negative.   Cardiovascular: Negative.   Gastrointestinal: Negative.  Negative for nausea and vomiting.  Endocrine: Negative.   Genitourinary: Negative.   Musculoskeletal: Negative for neck pain and neck stiffness.  Skin: Negative.   Allergic/Immunologic: Negative.   Neurological: Positive for headaches. Negative for dizziness, tremors and weakness.  Hematological: Negative.   Psychiatric/Behavioral: Negative.     Allergies  Review of patient's allergies indicates no known allergies.  Home Medications   Current Outpatient Rx  Name  Route  Sig  Dispense  Refill  . cetirizine-pseudoephedrine (ZYRTEC-D) 5-120 MG per tablet   Oral   Take 1 tablet by mouth daily.   15 tablet   0   . fluticasone (FLONASE) 50 MCG/ACT nasal spray   Nasal   Place 2 sprays into the nose daily.   16 g   1   . traMADol (ULTRAM) 50 MG tablet   Oral   Take 1 tablet (50 mg total) by mouth every 6 (six) hours as needed for pain.   15 tablet   0    BP 122/81  Pulse 81  Temp(Src) 98.1 F (36.7 C) (Oral)  Resp 18  SpO2 100%  LMP 08/11/2013  Breastfeeding? No   Physical Exam  Nursing note and vitals reviewed. Constitutional: She is oriented to person, place, and time. She appears well-developed and  well-nourished. No distress.  HENT:  Head: Normocephalic and atraumatic.  Right Ear: External ear normal. A middle ear effusion is present. No decreased hearing is noted.  Left Ear: External ear normal. A middle ear effusion is present.  Mouth/Throat: Oropharynx is clear and moist.  Lateral nasal turbinates boggy and enlarged. Nares patent bilaterally. Equally discharge noted in posterior oropharynx no erythema or tonsillar enlargement noted.  Lateral middle ear effusions noted. Panic membranes pearly-gray in appearance, however unable to visualize bony dominances and light reflexes distorted bilaterally.   Neck: Normal range of motion. Neck supple. No tracheal deviation present.  Cardiovascular: Normal rate, regular rhythm, normal heart sounds and intact distal pulses.  Exam reveals no gallop and no friction rub.   No murmur heard. Pulmonary/Chest: Effort normal and breath sounds normal. No respiratory distress. She has no wheezes. She has no rales. She exhibits no tenderness.  Lymphadenopathy:    She has no cervical adenopathy.  Neurological: She is alert and oriented to person, place, and time.  Skin: Skin is warm and dry. No rash noted. She is not diaphoretic. No erythema. No pallor.  Psychiatric: She has a normal mood and affect. Her behavior is normal.   Transillumination of the maxillary sinuses shows congestion bilaterally with R maxillary> L maxillary sinus.   ED Course  Procedures (including critical care time) Labs Review Labs Reviewed - No data to display Imaging Review No results found.  MDM   1. Sinus headache   2. Serous otitis media, bilateral    Meds ordered this encounter  Medications  . fluticasone (FLONASE) 50 MCG/ACT nasal spray    Sig: Place 2 sprays into the nose daily.    Dispense:  16 g    Refill:  1  . cetirizine-pseudoephedrine (ZYRTEC-D) 5-120 MG per tablet    Sig: Take 1 tablet by mouth daily.    Dispense:  15 tablet    Refill:  0  . traMADol (ULTRAM) 50 MG tablet    Sig: Take 1 tablet (50 mg total) by mouth every 6 (six) hours as needed for pain.    Dispense:  15 tablet    Refill:  0   Discussed plan of care with patient, patient verbalized understanding.         Weber Cooks, NP 09/07/13 1420

## 2013-09-07 NOTE — ED Notes (Signed)
Pt c/o right sided headache onset 2 weeks Pain is constant... And not aggravated by light or noise Sxs also include: facial pressure on right side and right ear pain Denies: f/v/n/d, cold sxs Has tried OTC cold meds w/no relief.  Alert w/no signs of acute distress.

## 2014-04-21 ENCOUNTER — Encounter (HOSPITAL_COMMUNITY): Payer: Self-pay | Admitting: Emergency Medicine

## 2014-04-21 ENCOUNTER — Emergency Department (INDEPENDENT_AMBULATORY_CARE_PROVIDER_SITE_OTHER)
Admission: EM | Admit: 2014-04-21 | Discharge: 2014-04-21 | Disposition: A | Discharge status: Home / Self Care | Payer: Self-pay | Source: Home / Self Care | Attending: Family Medicine | Admitting: Family Medicine

## 2014-04-21 DIAGNOSIS — S134XXA Sprain of ligaments of cervical spine, initial encounter: Secondary | ICD-10-CM

## 2014-04-21 DIAGNOSIS — S139XXA Sprain of joints and ligaments of unspecified parts of neck, initial encounter: Secondary | ICD-10-CM

## 2014-04-21 MED ORDER — TRAMADOL HCL 50 MG PO TABS: 50.0000 mg | ORAL_TABLET | Freq: Four times a day (QID) | ORAL | Status: AC | PRN

## 2014-04-21 MED ORDER — IBUPROFEN 800 MG PO TABS: 800.0000 mg | ORAL_TABLET | Freq: Three times a day (TID) | ORAL | Status: AC | PRN
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
## 2014-04-22 NOTE — ED Provider Notes (Signed)
Medical screening examination/treatment/procedure(s) were performed by resident physician or non-physician practitioner and as supervising physician I was immediately available for consultation/collaboration.   Barkley BrunsKINDL,JAMES DOUGLAS MD.   Linna HoffJames D Kindl, MD 04/22/14 [phone]

## 2014-09-08 ENCOUNTER — Encounter (HOSPITAL_COMMUNITY): Payer: Self-pay | Admitting: Emergency Medicine
